# Patient Record
Sex: Female | Born: 1960 | Race: Black or African American | Hispanic: No | Marital: Married
Health system: Southern US, Community
[De-identification: ages and names within clinical notes are randomized; demographics above are authoritative.]

---

## 2003-05-18 ENCOUNTER — Other Ambulatory Visit: Payer: Self-pay

## 2004-02-22 ENCOUNTER — Emergency Department: Payer: Self-pay | Admitting: Emergency Medicine

## 2004-09-04 ENCOUNTER — Emergency Department: Payer: Self-pay | Admitting: Emergency Medicine

## 2004-10-08 ENCOUNTER — Emergency Department: Payer: Self-pay | Admitting: Emergency Medicine

## 2004-10-08 ENCOUNTER — Other Ambulatory Visit: Payer: Self-pay

## 2005-01-13 ENCOUNTER — Emergency Department: Payer: Self-pay | Admitting: Emergency Medicine

## 2005-01-13 ENCOUNTER — Other Ambulatory Visit: Payer: Self-pay

## 2005-02-05 ENCOUNTER — Emergency Department: Payer: Self-pay | Admitting: Emergency Medicine

## 2005-02-17 ENCOUNTER — Other Ambulatory Visit: Payer: Self-pay

## 2005-02-17 ENCOUNTER — Emergency Department: Payer: Self-pay | Admitting: Emergency Medicine

## 2005-03-06 ENCOUNTER — Emergency Department: Payer: Self-pay | Admitting: Emergency Medicine

## 2005-05-28 ENCOUNTER — Emergency Department: Payer: Self-pay | Admitting: Emergency Medicine

## 2006-02-03 ENCOUNTER — Emergency Department: Payer: Self-pay | Admitting: Emergency Medicine

## 2006-02-08 ENCOUNTER — Emergency Department: Payer: Self-pay | Admitting: Emergency Medicine

## 2006-07-18 ENCOUNTER — Emergency Department: Payer: Self-pay | Admitting: General Practice

## 2006-07-18 ENCOUNTER — Other Ambulatory Visit: Payer: Self-pay

## 2006-09-10 IMAGING — CR DG LUMBAR SPINE AP/LAT/OBLIQUES W/ FLEX AND EXT
1 series · 5 of 5 positions shown · non-contrast
Comparison: none

REASON FOR EXAM: Fall pain pt in rm 16
COMMENTS:

PROCEDURE:     DXR - DXR LUMBAR SPINE WITH OBLIQUES  - March 06, 2005  [DATE]
RESULT:       Lumbar spine examination demonstrates the vertebral body
heights are maintained.  There is disc space narrowing at L5-S1.  There is
facet hypertrophy at L4-5 and L5-S1.  No definite fracture is identified.

[Series 1: view not recorded · 0.17mm/px · 5 of 5 slices shown]
[im 1/5]
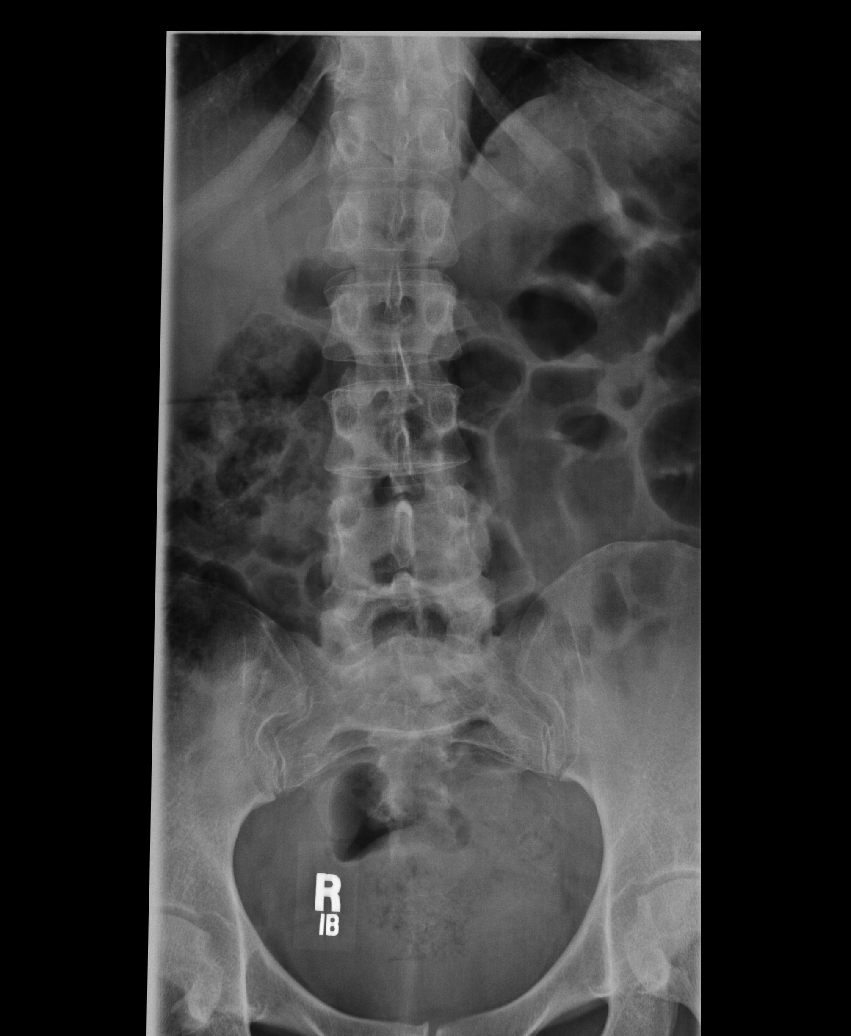
[im 2/5]
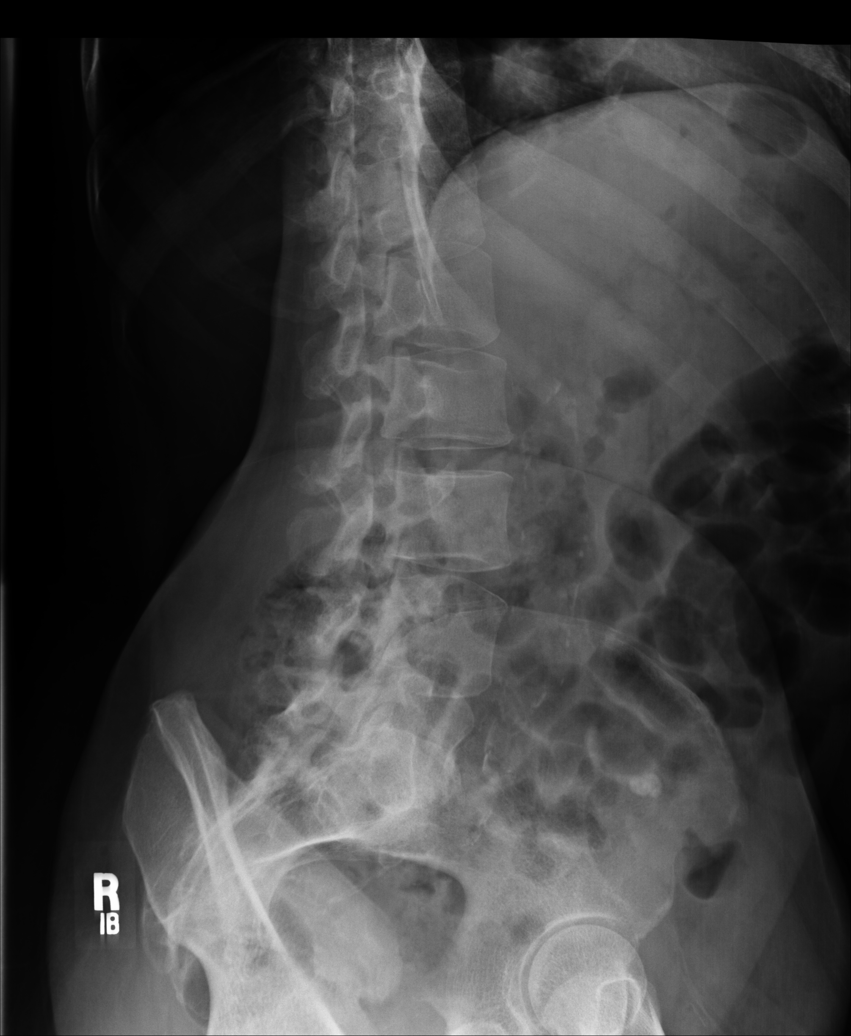
[im 3/5]
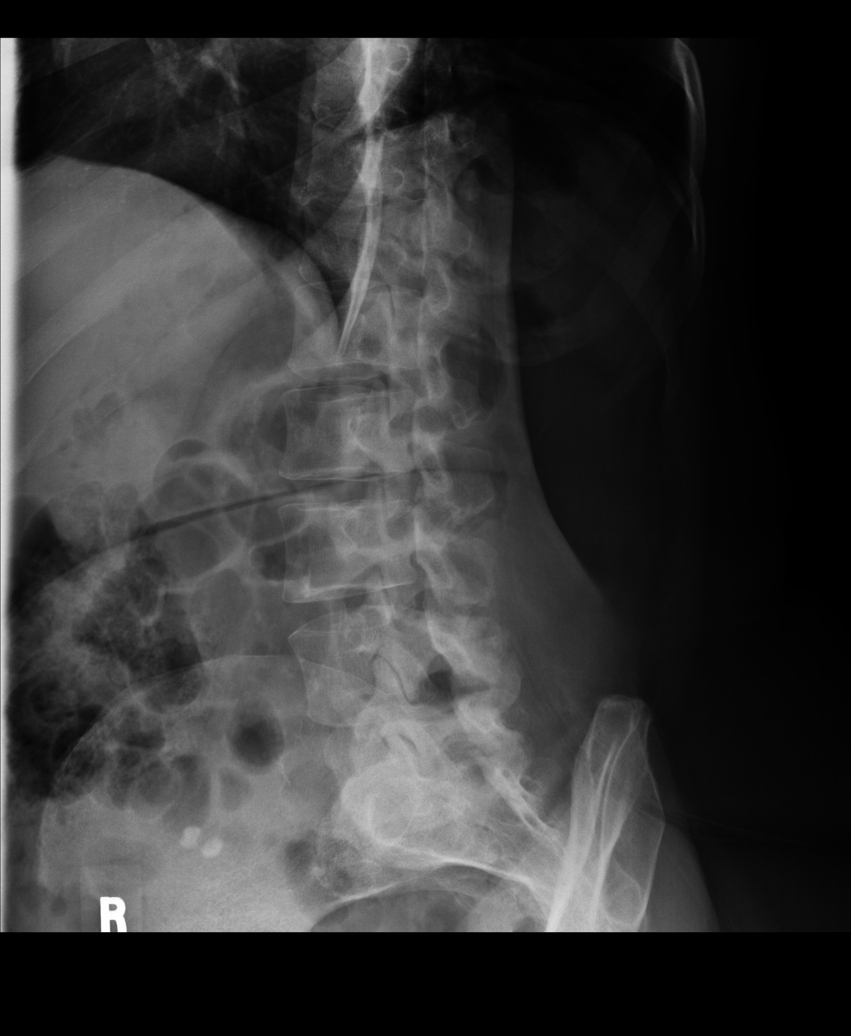
[im 4/5]
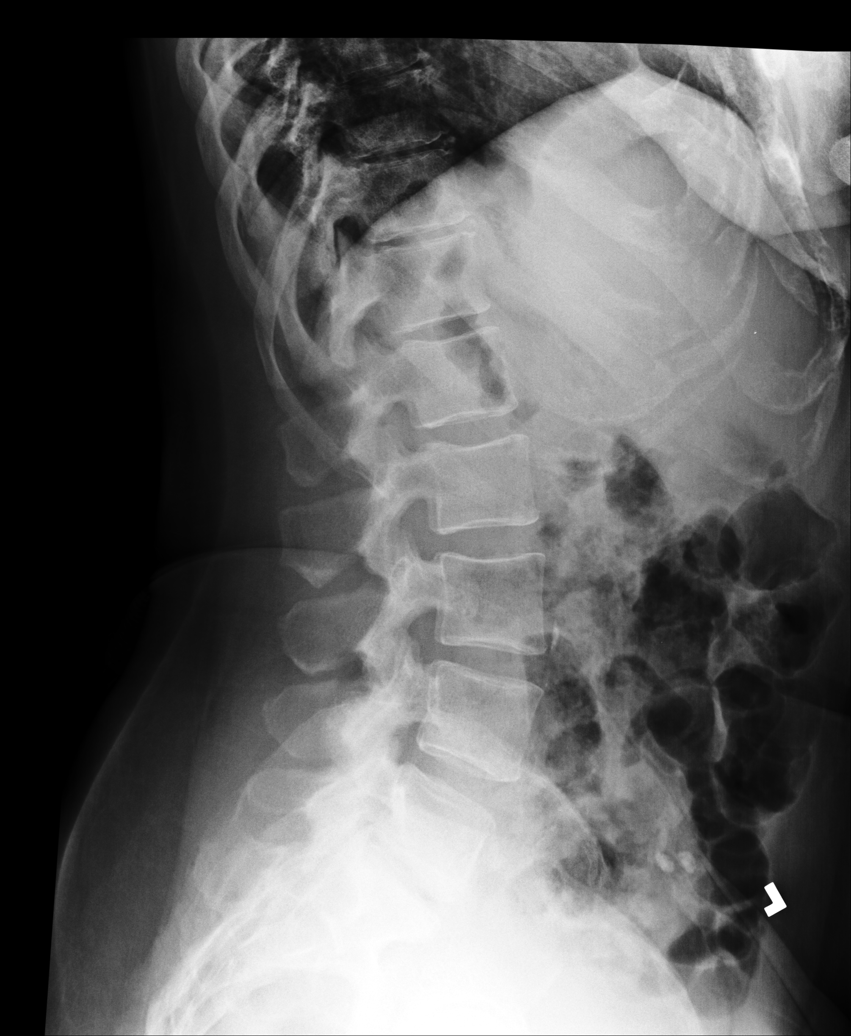
[im 5/5]
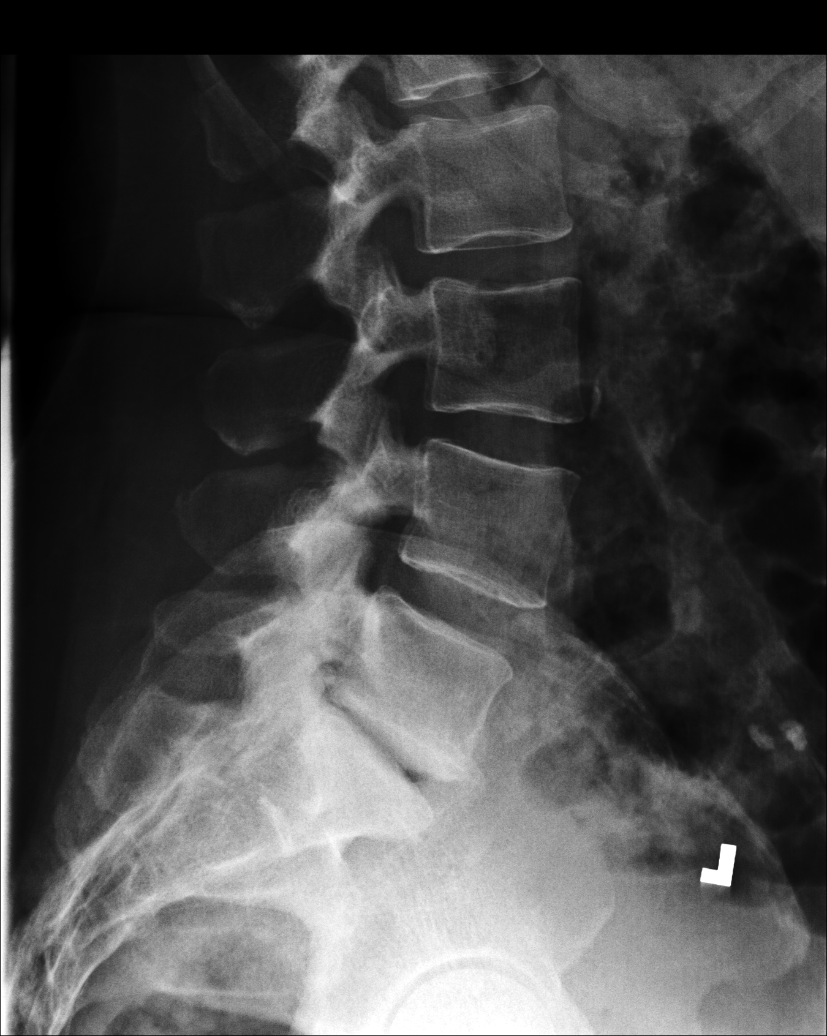

[5 of 5 positions shown; findings below may reference images not displayed]

IMPRESSION: Some degenerative changes in the lower lumbar spine.  No acute bony
abnormality evident.

## 2006-09-10 IMAGING — CR CERVICAL SPINE - COMPLETE 4+ VIEW
1 series · 7 of 7 positions shown · non-contrast
Comparison: none

REASON FOR EXAM: Fall, pain
COMMENTS:

[Series 1: view not recorded · 0.17mm/px · 7 of 7 slices shown]
[im 1/7]
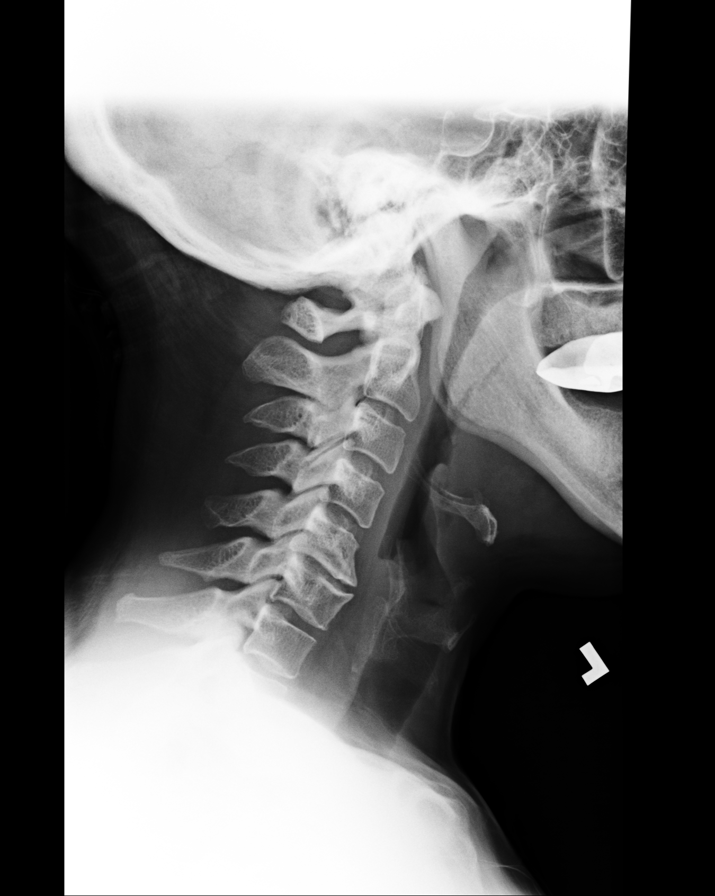
[im 2/7]
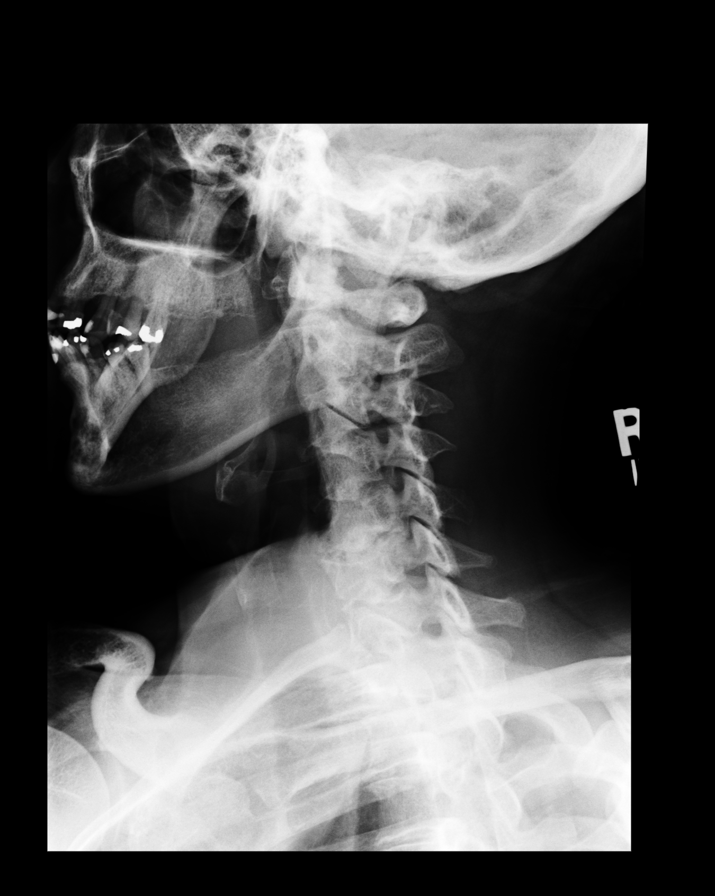
[im 3/7]
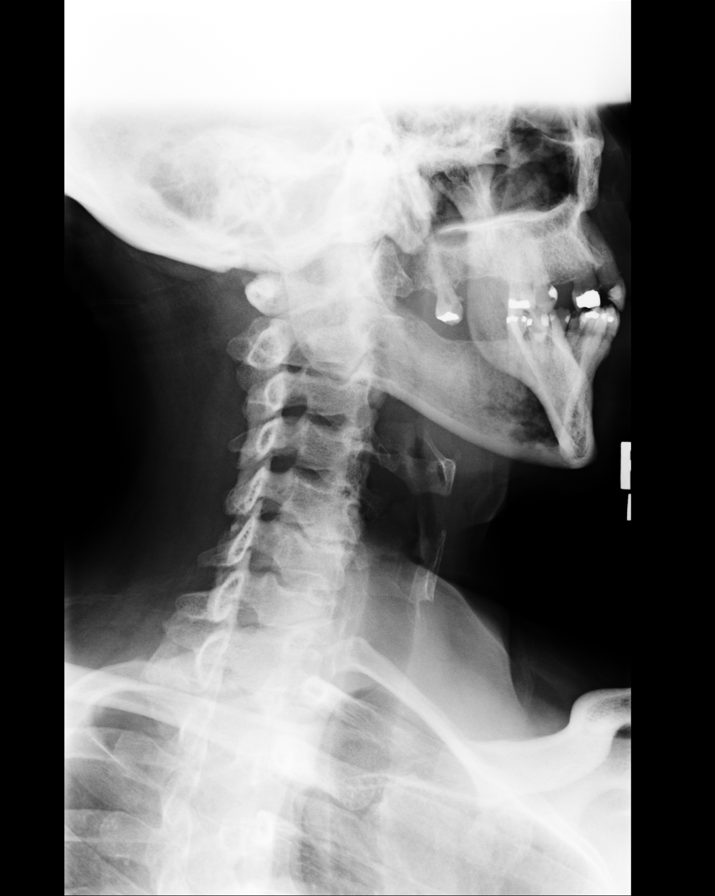
[im 4/7]
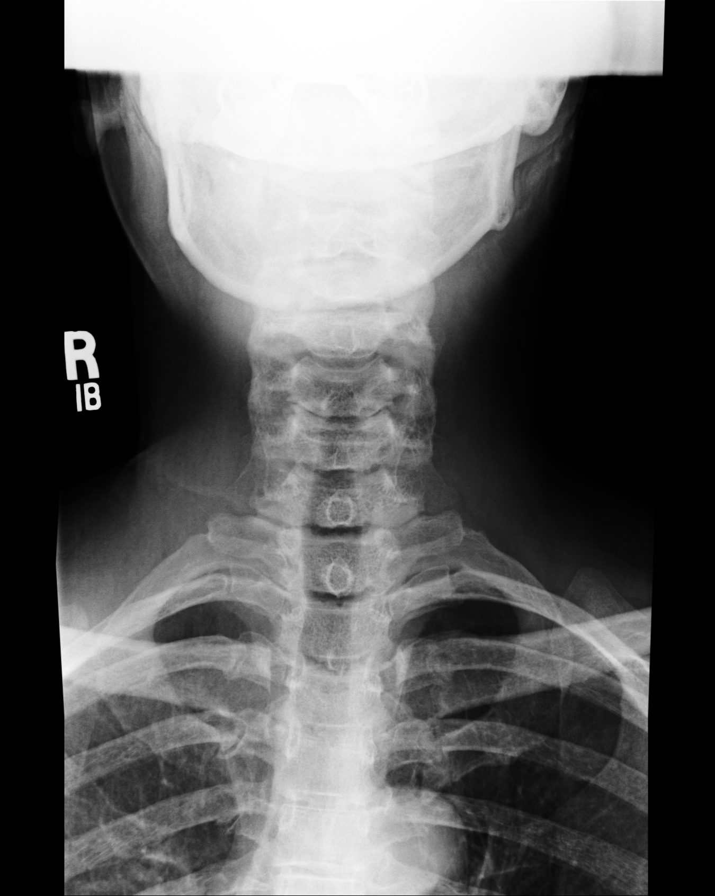
[im 5/7]
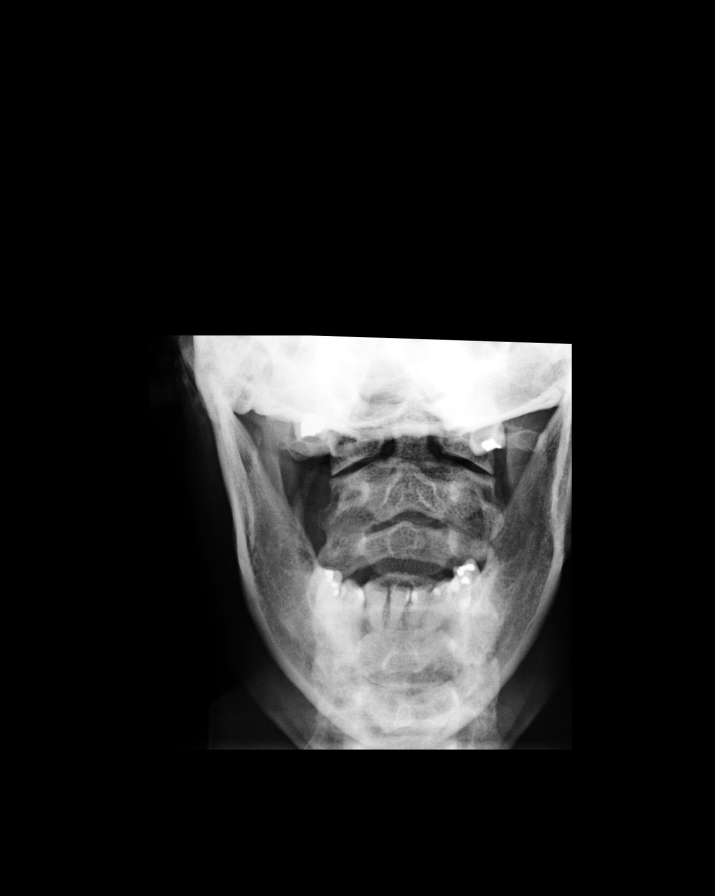
[im 6/7]
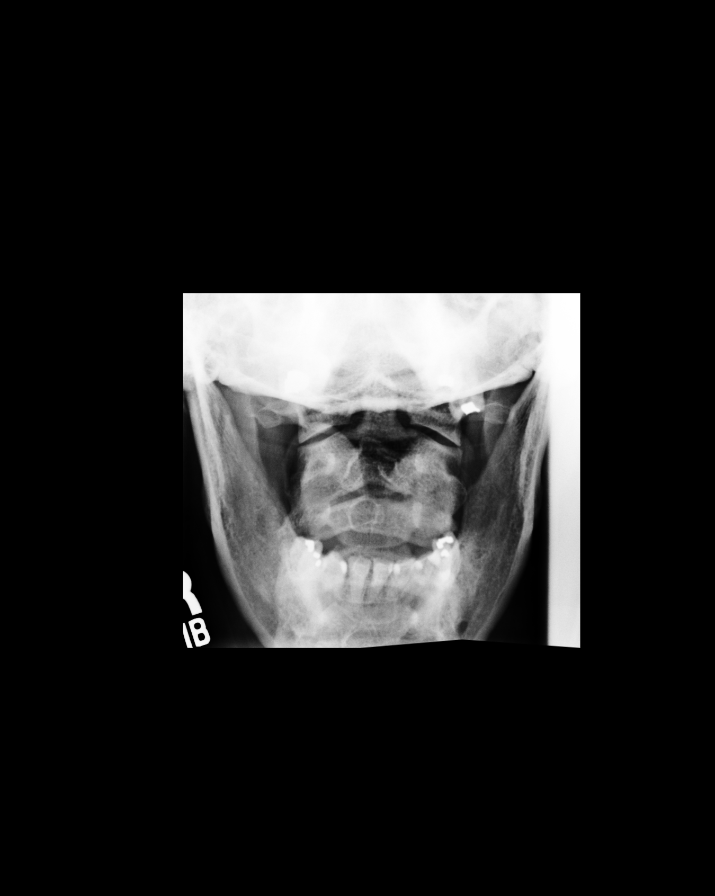
[im 7/7]
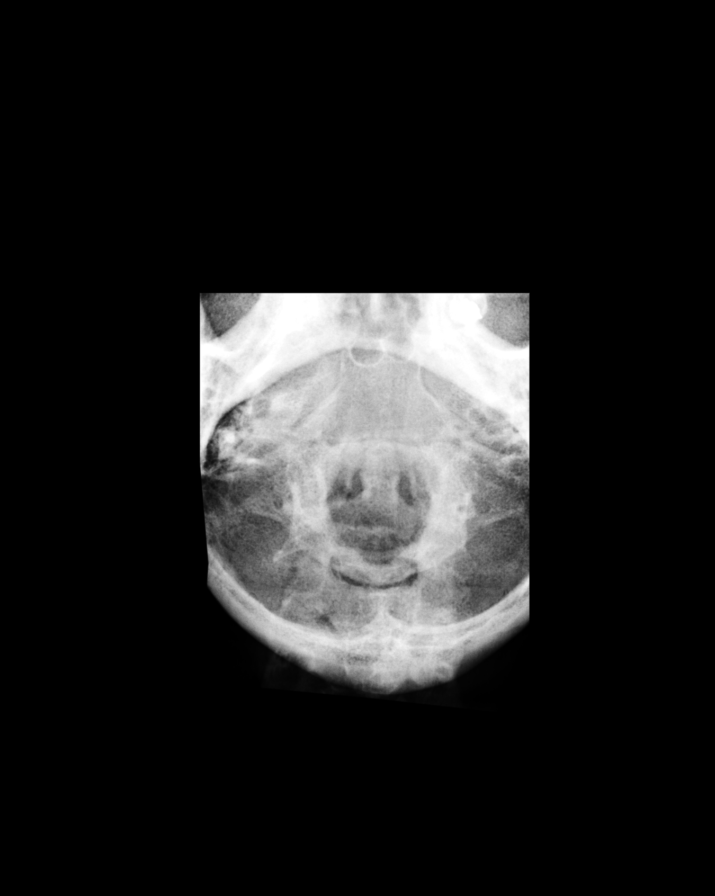

[7 of 7 positions shown; findings below may reference images not displayed]

PROCEDURE:     DXR - DXR CERVICAL SPINE COMPLETE  - March 06, 2005  [DATE]

RESULT:     Images demonstrate intervertebral disc space narrowing with
hypertrophic spurring at the C5-6 level. There is no subluxation. The
prevertebral soft tissues are normal. The odontoid and lateral masses of C1
and C2 appear to be normally aligned.
IMPRESSION: No acute bony abnormality. Degenerative change is noted.

## 2007-05-01 ENCOUNTER — Emergency Department: Payer: Self-pay | Admitting: Emergency Medicine

## 2007-08-02 ENCOUNTER — Other Ambulatory Visit: Payer: Self-pay

## 2007-08-02 ENCOUNTER — Emergency Department: Payer: Self-pay | Admitting: Emergency Medicine

## 2007-08-15 ENCOUNTER — Emergency Department: Payer: Self-pay

## 2007-10-01 ENCOUNTER — Emergency Department: Payer: Self-pay | Admitting: Emergency Medicine

## 2007-10-01 ENCOUNTER — Other Ambulatory Visit: Payer: Self-pay

## 2008-03-05 ENCOUNTER — Emergency Department: Payer: Self-pay | Admitting: Emergency Medicine

## 2008-12-18 ENCOUNTER — Emergency Department: Payer: Self-pay | Admitting: Emergency Medicine

## 2009-11-28 ENCOUNTER — Emergency Department: Payer: Self-pay | Admitting: Emergency Medicine

## 2009-11-29 ENCOUNTER — Emergency Department: Payer: Self-pay | Admitting: Emergency Medicine

## 2011-02-16 LAB — CBC
HCT: 33.9 % — ABNORMAL LOW (ref 35.0–47.0)
MCV: 89 fL (ref 80–100)
RDW: 16 % — ABNORMAL HIGH (ref 11.5–14.5)
WBC: 8.4 10*3/uL (ref 3.6–11.0)

## 2011-02-16 LAB — BASIC METABOLIC PANEL
Anion Gap: 12 (ref 7–16)
BUN: 22 mg/dL — ABNORMAL HIGH (ref 7–18)
Calcium, Total: 8.8 mg/dL (ref 8.5–10.1)
Chloride: 106 mmol/L (ref 98–107)
Co2: 24 mmol/L (ref 21–32)
Creatinine: 1.77 mg/dL — ABNORMAL HIGH (ref 0.60–1.30)
EGFR (Non-African Amer.): 32 — ABNORMAL LOW
Glucose: 120 mg/dL — ABNORMAL HIGH (ref 65–99)
Osmolality: 288 (ref 275–301)
Potassium: 3.7 mmol/L (ref 3.5–5.1)

## 2011-02-16 LAB — ETHANOL
Ethanol %: <0.003 % (ref 0.000–0.080)
Ethanol: <3 mg/dL

## 2011-02-17 ENCOUNTER — Inpatient Hospital Stay: Payer: Self-pay | Admitting: Internal Medicine

## 2011-02-17 LAB — URINALYSIS, COMPLETE
Nitrite: NEGATIVE
Protein: 100
RBC,UR: 70 /HPF (ref 0–5)
WBC UR: 9 /HPF (ref 0–5)

## 2011-02-17 LAB — CK TOTAL AND CKMB (NOT AT ARMC)
CK, Total: 1395 U/L — ABNORMAL HIGH (ref 21–215)
CK, Total: 1313 U/L — ABNORMAL HIGH (ref 21–215)
CK-MB: 6.4 ng/mL — ABNORMAL HIGH (ref 0.5–3.6)

## 2011-02-17 LAB — APTT
Activated PTT: 38.8 secs — ABNORMAL HIGH (ref 23.6–35.9)
Activated PTT: 72.2 secs — ABNORMAL HIGH (ref 23.6–35.9)

## 2011-02-17 LAB — TROPONIN I
Troponin-I: <0.02 ng/mL
Troponin-I: <0.02 ng/mL

## 2011-02-18 LAB — BASIC METABOLIC PANEL
Chloride: 107 mmol/L (ref 98–107)
Co2: 25 mmol/L (ref 21–32)
Creatinine: 1.02 mg/dL (ref 0.60–1.30)
Glucose: 112 mg/dL — ABNORMAL HIGH (ref 65–99)
Potassium: 3.2 mmol/L — ABNORMAL LOW (ref 3.5–5.1)
Sodium: 140 mmol/L (ref 136–145)

## 2011-02-18 LAB — CBC WITH DIFFERENTIAL/PLATELET
Basophil %: 0 %
Eosinophil #: 0 10*3/uL (ref 0.0–0.7)
HGB: 9.6 g/dL — ABNORMAL LOW (ref 12.0–16.0)
Lymphocyte #: 0.8 10*3/uL — ABNORMAL LOW (ref 1.0–3.6)
Lymphocyte %: 17 %
MCH: 29.1 pg (ref 26.0–34.0)
MCHC: 32.2 g/dL (ref 32.0–36.0)
Monocyte #: 0.6 10*3/uL (ref 0.0–0.7)
Neutrophil #: 3.1 10*3/uL (ref 1.4–6.5)
Neutrophil %: 68.9 %
RDW: 16.1 % — ABNORMAL HIGH (ref 11.5–14.5)
WBC: 4.5 10*3/uL (ref 3.6–11.0)

## 2011-02-18 LAB — LIPID PANEL
HDL Cholesterol: 32 mg/dL — ABNORMAL LOW (ref 40–60)
Ldl Cholesterol, Calc: 123 mg/dL — ABNORMAL HIGH (ref 0–100)
Triglycerides: 107 mg/dL (ref 0–200)

## 2011-02-18 LAB — MAGNESIUM: Magnesium: 1.9 mg/dL

## 2011-02-22 LAB — CULTURE, BLOOD (SINGLE)

## 2011-06-05 IMAGING — CR DG CHEST 1V PORT
1 series · 1 of 1 positions shown · non-contrast
Comparison: none

REASON FOR EXAM: hx lung ca, wheezing rll
COMMENTS:

[view not recorded]
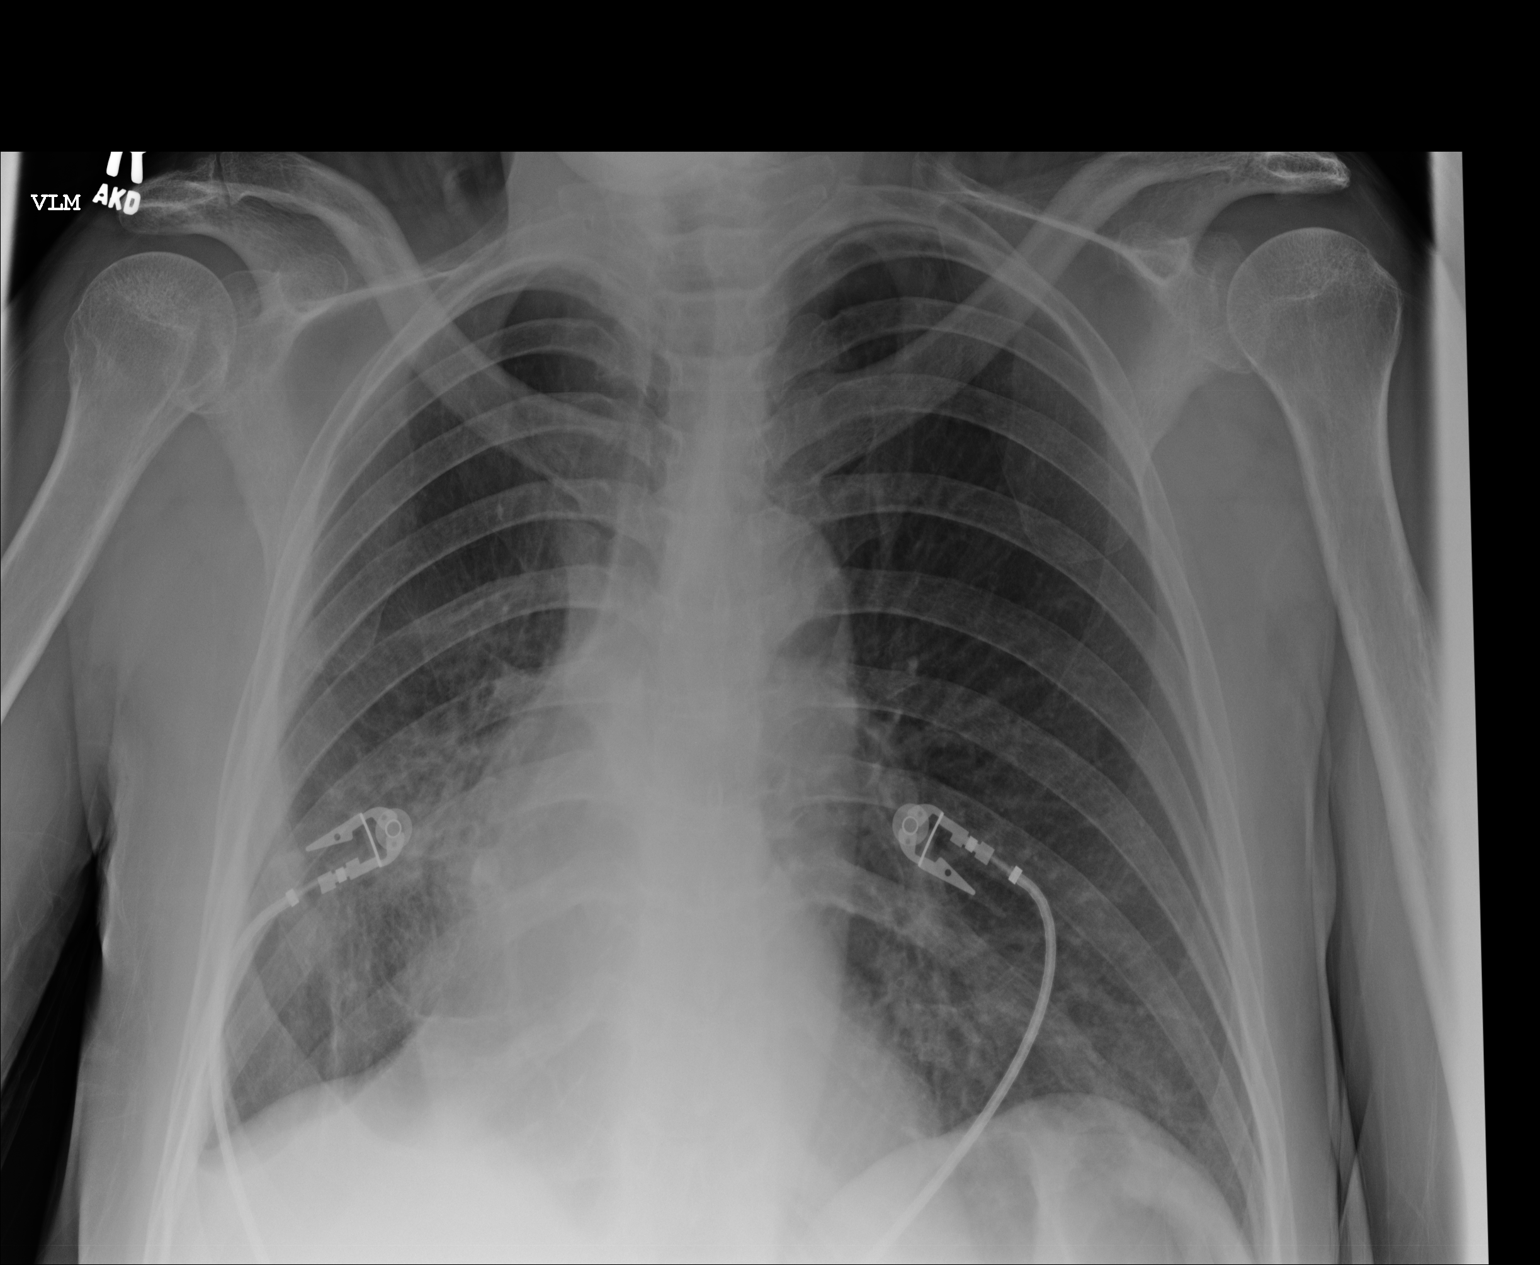

[1 of 1 positions shown; findings below may reference images not displayed]

PROCEDURE:     DXR - DXR PORTABLE CHEST SINGLE VIEW  - November 29, 2009 [DATE]

RESULT:     Comparison is made to the prior exam of 12/18/2008.

There is observed increased density in the right lower lung field and
possibly right middle lobe. The density is much improved as compared to the
exam of 12/18/2008 and may in great part be secondary to fibrosis. There is
evidence of volume loss on the right which would favor fibrosis or
atelectasis. Residual tumor cannot be entirely excluded from the
differential. There is also noted a patchy area of increased density at the
left base which is not seen previously and could represent early pneumonia
or developing fibrosis. The heart size is normal. No pleural effusion is
seen. Monitoring electrodes are present.
IMPRESSION: 1.  There is increased density in the lower lung fields bilaterally, more
prominent on the right. The findings likely are secondary to fibrotic change
but residual tumor or developing infiltrates cannot be entirely excluded at
this point. Continued follow-up and possibly chest CT are recommended, if
such is clinically indicated.
2.  The heart size is normal.
3.  No pulmonary edema or pleural effusion is noted.

## 2011-07-18 ENCOUNTER — Emergency Department: Payer: Self-pay | Admitting: Emergency Medicine

## 2014-05-28 NOTE — Discharge Summary (Signed)
PATIENT NAME:  Christina Bridges, [name] MR#:  [id number] DATE OF BIRTH:  Sep 07, 1960  DATE OF ADMISSION:  02/17/2011 DATE OF DISCHARGE:  02/18/2011  DISCHARGE DIAGNOSES:  1. Pneumonia.  2. Encephalopathy possibly due to taking her nighttime medications in the daytime. 3. Hypotension. 4. Anemia likely of chronic disease.  5. Hyperglycemia.  6. Chest pain due to pneumonia.  7. Mild rhabdomyolysis. 8. Acute renal failure, resolved.  9. History of tobacco abuse. 10. Seizure disorder. 11. Depression. 12. Hyperlipidemia. 13. Chronic obstructive pulmonary disease. 14. Hypokalemia.   DISPOSITION: The patient is being discharged home to follow-up with primary care physician in 1 to 2 weeks after discharge.   DIET: Regular.   ACTIVITY: As tolerated.   DISCHARGE MEDICATIONS:  1. Levaquin 500 mg daily for seven days. 2. ProAir 2 puffs q.6 hours p.r.n.  3. Advair 500/50 1 puff b.i.d.  4. Seroquel 200 mg 3.5 tablets at bedtime.  5. Neurontin 300 mg 2 capsules 3 times a day. 6. Keppra 750 mg 2 tablets b.i.d.  7. Simvastatin 20 mg daily.  8. Vimpat 100 mg b.i.d.   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  Blood cultures showed no growth so far. CBC essentially normal other than hemoglobin of 11.2 to 9.6. Creatinine 1.77 on admission, normal by the time of discharge. Potassium 3.2, replaced. Hyperglycemia. LDL 123. Rest of cholesterol profile normal. Serial cardiac enzymes negative.  HOSPITAL COURSE: The patient is a 54 year old female with past medical history of seizure disorder, lung cancer status post treatment, and chronic obstructive pulmonary disease who presented with change in mental status. The patient reported that she took her nighttime medications including narcotic and sedative which she usually takes at night in the daytime. She was also found to have right-sided pneumonia and  hypotension initially. She was admitted to the hospital and monitored. All her sedative medications were held which resulted in complete resolution of her encephalopathy.   For her right-sided pneumonia, she was treated with empiric antibiotics. Her blood cultures have been negative so far. The patient was hypotensive initially possibly due to taking extra doses of her medications. She was treated with IV fluids which resulted in resolution of her hypotension. She complained of some chest pain. Three sets of cardiac enzymes were checked which were negative. The chest pain was possibly due to pneumonia. She also had acute renal failure possibly due to dehydration which has resolved by the time of discharge. Her fasting lipid profile was checked and her LDL is 123l. Her chronic obstructive pulmonary disease remained stable during the hospitalization. She was found to have mild hypokalemia which was replaced.   Overall the patient's condition remained stable during the hospitalization and she is being discharged home in a stable condition.   TIME SPENT: 45 minutes.   ____________________________ Darrick MeigsSangeeta Jurnie Garritano, MD sp:drc D: 02/18/2011 16:45:32 ET T: 02/19/2011 10:37:23 ET JOB#: [id number]  cc: Darrick MeigsSangeeta Scarlet Abad, MD, <Dictator> Darrick MeigsSANGEETA Elizah Mierzwa MD ELECTRONICALLY SIGNED 02/19/2011 16:27

## 2014-05-28 NOTE — H&P (Signed)
PATIENT NAME:  Christina Bridges, Christina Bridges MR#:  [id number] DATE OF BIRTH:  [birth year]  DATE OF ADMISSION:  02/16/2011  PRIMARY CARE PHYSICIAN:  Dr.  at, [facility] ER PHYSICIAN: Dr. Mindi Junker  ADMITTING PHYSICIAN: Dr. Tilda Franco   PRESENTING COMPLAINT: Chest pain with altered mental status.   HISTORY OF PRESENT ILLNESS: The patient is a 54 year old lady who was brought to the Emergency Room by her husband via private car for altered mental status with increased confusion and lethargy. Her husband noted that the patient had earlier complained of chest pain this afternoon and had taken multiple medications trying to overcome pain. She soon became very drowsy and confused. For this she was brought to the Emergency Room. Here work-up included a chest x-ray which showed a right lower lobe infiltrate. The patient was noted to be drowsy and lethargic during this period.  She was started on clindamycin for possible aspiration pneumonia and referred to hospitalist for further evaluation. EKG done shows sinus tachycardia at a rate of 118, no acute ST-T wave changes. The patient also received one dose of Narcan with some improvement in her mental status. At the time of my examination the patient is sleepy but easily arousable and does state that she did have chest pain earlier today.  The pain was substernal in location, sharp, intensity 10/10, nonradiating, worsened by food. Denies any palpitations. No diaphoresis. No shortness of breath. No PND, orthopnea, or pedal edema. With continued persistence of the pain she had taken extra medication of pain medication, Percocet, oxycodone, Tylenol, and ibuprofen to overcome the pain and does not recall what happened afterwards. Denied any suicidal or homicidal ideation or being depressed. She admits the pain got worse after she ate but no nausea or vomiting. No diarrhea. No PND. No rashes, recent trauma, long distance travel, or lifting of any heavy objects.   REVIEW OF SYSTEMS:   CONSTITUTIONAL: Denies any fever. Admits to feeling weak. No weight loss or weight gain. EYES: No blurred vision. No discharge or redness. ENT: No tinnitus, epistaxis, or difficulty swallowing. RESPIRATORY: Admits to some cough which is nonproductive. No wheezing or painful respirations. CARDIOVASCULAR: Positive for chest pain but no syncope, palpitations, or exertional dyspnea. GI: Denies any nausea, no vomiting. No diarrhea. No change in bowel habits. GU: Denies any dysuria, frequency, or incontinence.  ENDOCRINE: No polyuria, polydipsia, heat or cold intolerance. HEMATOLOGIC: No anemia, easy bruising or bleeding, or swollen glands. SKIN: No rashes. No change in hair or skin texture.  MUSCULOSKELETAL: No joint pains, redness, swelling, or limited activity. NEURO: Positive for generalized weakness now. Drowsiness but no vertigo or tremor.  PSYCHIATRIC: Denies any depression or nervousness. No anxiety.   PAST MEDICAL HISTORY:  1. Hypertension.  2. Hyperlipidemia.  3. Depression.  4. Anemia of chronic disease.  5. History of seizures.  6. Asthma. 7. Chronic obstructive pulmonary disease. 8. History of lung cancer status post chemotherapy with carboplatin and etoposide and radiotherapy.  9. 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   PAST SURGICAL HISTORY:  Hysterectomy with oophorectomy.   SOCIAL HISTORY: She lives with her husband. Active cigarette smoker, 34 pack-year history. She denies any recreational drug use.   FAMILY HISTORY: Positive for metastatic kidney cancer in her mother, tuberculosis in the father.  History of coronary artery disease in first-degree relatives, in her brothers.   ALLERGIES: Aspirin gives her hives and ibuprofen causes generalized itching.  MEDICATIONS:  1. Acetaminophen/ codeine 300 mg/30, 1 tablet once a day at bedtime.  2. Advair Diskus 500 mcg/ 50, 1 puff  twice a day.  3. Alprazolam 0.5 mg 3 times daily. 4. Doc-Q-Lace 100 mg 1 tablet twice a day. 5. Flovent inhaler 1 puff twice a day.  6. Gabapentin 300 mg, 2 capsules 3 times a day.  7. Keppra 750 mg 2 tablets twice a day.  8. Morphine 15 mg 1 tab in the morning and at bedtime.  9. Oxycodone 5 mg, 1 cap q. 8 hours. 10. ProAir inhaler 1 puff p.r.n.  11. Prochlorperazine 10 mg 3 times daily.  12. Quetiapine 200 mg, 3.5 tablets at bedtime.  13. Simvastatin 20 mg once at bedtime.  14. Vimpat 100 mg twice daily.   PHYSICAL EXAMINATION:  VITAL SIGNS: Temperature 99.1, pulse 122, respiratory rate 20, blood pressure 80/55 on arrival, oxygen saturation 92% on room air.   GENERAL: Middle-aged [place] lady sleeping comfortably on the gurney but easily arousable. She is drowsy but aware and oriented to person, place, and time, in no obvious distress.   HEENT: Atraumatic, normocephalic. Pupils equal, reactive to light and accommodation. Extraocular movements intact. Mucous membranes pink, moist.   NECK: Supple. No JV distention.   CHEST: Good air entry. Transmitted breath sounds with decreased air entry in the right lung base. No obvious rhonchi, no rales.   HEART: Regular rhythm, tachycardic. No murmurs.   ABDOMEN:  Full, moves with respiration. No tenderness. Bowel sounds normoactive. No organomegaly.   EXTREMITIES: No edema. No clubbing. No deformity.   NEUROLOGICAL: Cranial nerves II through XII grossly intact. No focal motor or sensory deficit. Affect flat, but seems to have insight into her condition.   LABORATORY, DIAGNOSTIC, AND RADIOLOGICAL DATA: EKG shows sinus tachycardia, rate of 118, nonspecific T- wave changes. Chest x-ray shows right lower lobe infiltrate by my interpretation.   CBC unremarkable with white count 8, hemoglobin 11, platelets 189. Chemistry shows sodium 142, potassium 3.7, bicarbonate 24, creatinine 1.7, up from a baseline of 1.3 from four months ago. BUN  22, glucose 120. Alcohol level is less than 3.  No cardiac enzymes in the system.    IMPRESSION:  1. Altered mental status secondary to accidental overdose.  2. Possible aspiration pneumonia.  3. Chest pain, rule out ACS in view of smoking history, family history. Also possibly gastritis.  4. Acute kidney injury not otherwise specified.  5. History of tobacco use. 6. Seizure disorder.  7. Hypertension, currently hypotensive.   8. History of depression, stable.  9. Hyperlipidemia, stable.  10. History of chronic obstructive pulmonary disease/asthma, stable.   PLAN:  Admit to general medical floor with telemetry for respiratory support. IV antibiotics. P.r.n. nebulizers. IV fluid for rehydration. We will obtain blood cultures times two.  Obtain serial cardiac enzymes. Check TSH and magnesium. Smoking cessation advised. Nicotine patch offered. Continue seizure, fall, and aspiration precautions. Echo and stress test in the a.m. if ruled out by enzymes. Aspirin cannot be used here due to her allergy aspirin. P.r.n. nitroglycerin and morphine.  GI prophylaxis with Protonix. Deep vein thrombosis prophylaxis with heparin.  P.r.n. Narcan if the patient becomes unresponsive.  Resume the rest of her outpatient medications but hold off on the quetiapine, oxycodone, morphine, gabapentin, and alprazolam until she is fully awake.  CODE STATUS: FULL CODE.   TOTAL PATIENT CARE TIME: 50 minutes.   ____________________________ Christina SabinaMarcel I. Tilda FrancoAkuneme, MD mia:bjt D: 02/17/2011 01:00:21 ET T: 02/17/2011 07:13:11 ET  JOB#: [id number]  cc: Jolene Guyett I. Tilda Franco, MD, <Dictator> Margaret Pyle MD ELECTRONICALLY SIGNED 02/20/2011 16:29

## 2014-11-20 ENCOUNTER — Other Ambulatory Visit
Admission: RE | Admit: 2014-11-20 | Discharge: 2014-11-20 | Disposition: A | Discharge status: Home / Self Care | Payer: Medicare HMO | Source: Ambulatory Visit | Attending: Student | Admitting: Student

## 2014-11-20 DIAGNOSIS — M868X8 Other osteomyelitis, other site: Secondary | ICD-10-CM | POA: Diagnosis present

## 2014-11-20 LAB — CREATININE, SERUM
Creatinine, Ser: 0.77 mg/dL (ref 0.44–1.00)
GFR calc Af Amer: >60 mL/min (ref >60)

## 2014-11-20 LAB — CBC WITH DIFFERENTIAL/PLATELET
BASOS ABS: 0 10*3/uL (ref 0–0.1)
Basophils Relative: 1 %
EOS PCT: 0 %
Eosinophils Absolute: 0 10*3/uL (ref 0–0.7)
HEMATOCRIT: 30.4 % — AB (ref 35.0–47.0)
Hemoglobin: 9.8 g/dL — ABNORMAL LOW (ref 12.0–16.0)
LYMPHS PCT: 32 %
Lymphs Abs: 1.1 10*3/uL (ref 1.0–3.6)
MCH: 28.5 pg (ref 26.0–34.0)
MCHC: 32.4 g/dL (ref 32.0–36.0)
MCV: 88 fL (ref 80.0–100.0)
MONO ABS: 0.5 10*3/uL (ref 0.2–0.9)
MONOS PCT: 14 %
NEUTROS ABS: 1.8 10*3/uL (ref 1.4–6.5)
Neutrophils Relative %: 53 %
PLATELETS: 176 10*3/uL (ref 150–440)
RBC: 3.45 MIL/uL — ABNORMAL LOW (ref 3.80–5.20)
RDW: 17.9 % — AB (ref 11.5–14.5)
WBC: 3.4 10*3/uL — ABNORMAL LOW (ref 3.6–11.0)

## 2014-11-20 LAB — BUN: BUN: 7 mg/dL (ref 6–20)

## 2014-11-21 LAB — VANCOMYCIN, TROUGH: VANCOMYCIN TR: 28 ug/mL — AB (ref 10–20)

## 2014-11-23 ENCOUNTER — Other Ambulatory Visit
Admission: RE | Admit: 2014-11-23 | Discharge: 2014-11-23 | Disposition: A | Discharge status: Home / Self Care | Payer: Medicare HMO | Source: Ambulatory Visit | Attending: *Deleted | Admitting: *Deleted

## 2014-11-29 ENCOUNTER — Other Ambulatory Visit
Admission: RE | Admit: 2014-11-29 | Discharge: 2014-11-29 | Disposition: A | Discharge status: Home / Self Care | Payer: Medicare HMO | Source: Ambulatory Visit | Attending: Student | Admitting: Student

## 2014-11-29 DIAGNOSIS — A419 Sepsis, unspecified organism: Secondary | ICD-10-CM | POA: Diagnosis present

## 2014-11-29 LAB — CREATININE, SERUM
Creatinine, Ser: 1.18 mg/dL — ABNORMAL HIGH (ref 0.44–1.00)
GFR, EST AFRICAN AMERICAN: 59 mL/min — AB (ref >60)
GFR, EST NON AFRICAN AMERICAN: 51 mL/min — AB (ref >60)

## 2014-11-29 LAB — CBC WITH DIFFERENTIAL/PLATELET
BASOS ABS: 0 10*3/uL (ref 0–0.1)
Basophils Relative: 1 %
EOS ABS: 0 10*3/uL (ref 0–0.7)
Eosinophils Relative: 0 %
HCT: 30.5 % — ABNORMAL LOW (ref 35.0–47.0)
Hemoglobin: 9.7 g/dL — ABNORMAL LOW (ref 12.0–16.0)
Lymphocytes Relative: 31 %
Lymphs Abs: 0.8 10*3/uL — ABNORMAL LOW (ref 1.0–3.6)
MCH: 27.7 pg (ref 26.0–34.0)
MCHC: 31.8 g/dL — ABNORMAL LOW (ref 32.0–36.0)
MCV: 87.3 fL (ref 80.0–100.0)
MONO ABS: 0.4 10*3/uL (ref 0.2–0.9)
Monocytes Relative: 15 %
Neutro Abs: 1.5 10*3/uL (ref 1.4–6.5)
Neutrophils Relative %: 53 %
PLATELETS: 175 10*3/uL (ref 150–440)
RBC: 3.5 MIL/uL — AB (ref 3.80–5.20)
RDW: 17.8 % — AB (ref 11.5–14.5)
WBC: 2.7 10*3/uL — AB (ref 3.6–11.0)

## 2014-11-29 LAB — BUN

## 2014-11-29 LAB — VANCOMYCIN, TROUGH: Vancomycin Tr: 37 ug/mL (ref 10–20)

## 2014-12-01 ENCOUNTER — Other Ambulatory Visit
Admission: RE | Admit: 2014-12-01 | Discharge: 2014-12-01 | Disposition: A | Discharge status: Home / Self Care | Payer: Medicare HMO | Source: Ambulatory Visit | Attending: Student | Admitting: Student

## 2014-12-01 DIAGNOSIS — A419 Sepsis, unspecified organism: Secondary | ICD-10-CM | POA: Diagnosis present

## 2014-12-01 LAB — VANCOMYCIN, RANDOM: VANCOMYCIN RM: 15 ug/mL

## 2014-12-01 LAB — CREATININE, SERUM
CREATININE: 1.18 mg/dL — AB (ref 0.44–1.00)
GFR calc Af Amer: 59 mL/min — ABNORMAL LOW (ref >60)
GFR calc non Af Amer: 51 mL/min — ABNORMAL LOW (ref >60)

## 2014-12-01 LAB — BUN: BUN: 6 mg/dL (ref 6–20)

## 2014-12-04 ENCOUNTER — Other Ambulatory Visit
Admission: RE | Admit: 2014-12-04 | Discharge: 2014-12-04 | Disposition: A | Discharge status: Home / Self Care | Payer: Medicare HMO | Source: Skilled Nursing Facility | Attending: Student | Admitting: Student

## 2014-12-04 DIAGNOSIS — A419 Sepsis, unspecified organism: Secondary | ICD-10-CM | POA: Insufficient documentation

## 2014-12-04 LAB — CBC WITH DIFFERENTIAL/PLATELET
BASOS ABS: 0 10*3/uL (ref 0–0.1)
EOS ABS: 0 10*3/uL (ref 0–0.7)
Eosinophils Relative: 0 %
HEMATOCRIT: 35.5 % (ref 35.0–47.0)
HEMOGLOBIN: 11 g/dL — AB (ref 12.0–16.0)
Lymphocytes Relative: 31 %
Lymphs Abs: 1.1 10*3/uL (ref 1.0–3.6)
MCH: 27.4 pg (ref 26.0–34.0)
MCHC: 31.1 g/dL — ABNORMAL LOW (ref 32.0–36.0)
MCV: 87.9 fL (ref 80.0–100.0)
Monocytes Absolute: 0.4 10*3/uL (ref 0.2–0.9)
NEUTROS ABS: 2 10*3/uL (ref 1.4–6.5)
Platelets: 145 10*3/uL — ABNORMAL LOW (ref 150–440)
RBC: 4.03 MIL/uL (ref 3.80–5.20)
RDW: 18.4 % — ABNORMAL HIGH (ref 11.5–14.5)
WBC: 3.6 10*3/uL (ref 3.6–11.0)

## 2014-12-04 LAB — CREATININE, SERUM
CREATININE: 1.23 mg/dL — AB (ref 0.44–1.00)
GFR calc Af Amer: 57 mL/min — ABNORMAL LOW (ref >60)
GFR, EST NON AFRICAN AMERICAN: 49 mL/min — AB (ref >60)

## 2014-12-04 LAB — BUN: BUN: 7 mg/dL (ref 6–20)

## 2014-12-04 LAB — VANCOMYCIN, TROUGH: VANCOMYCIN TR: 34 ug/mL — AB (ref 10–20)

## 2014-12-11 ENCOUNTER — Other Ambulatory Visit
Admission: RE | Admit: 2014-12-11 | Discharge: 2014-12-11 | Disposition: A | Discharge status: Home / Self Care | Payer: Medicare HMO | Source: Ambulatory Visit | Attending: Student | Admitting: Student

## 2014-12-11 DIAGNOSIS — A419 Sepsis, unspecified organism: Secondary | ICD-10-CM | POA: Diagnosis present

## 2014-12-11 LAB — CBC WITH DIFFERENTIAL/PLATELET
Basophils Absolute: 0 10*3/uL (ref 0–0.1)
Basophils Relative: 1 %
EOS ABS: 0 10*3/uL (ref 0–0.7)
Eosinophils Relative: 0 %
HCT: 31.2 % — ABNORMAL LOW (ref 35.0–47.0)
HEMOGLOBIN: 10 g/dL — AB (ref 12.0–16.0)
LYMPHS ABS: 0.9 10*3/uL — AB (ref 1.0–3.6)
LYMPHS PCT: 28 %
MCH: 28 pg (ref 26.0–34.0)
MCHC: 32.1 g/dL (ref 32.0–36.0)
MCV: 87.2 fL (ref 80.0–100.0)
Monocytes Absolute: 0.4 10*3/uL (ref 0.2–0.9)
Monocytes Relative: 11 %
NEUTROS PCT: 60 %
Neutro Abs: 1.9 10*3/uL (ref 1.4–6.5)
Platelets: 173 10*3/uL (ref 150–440)
RBC: 3.58 MIL/uL — AB (ref 3.80–5.20)
RDW: 18.6 % — ABNORMAL HIGH (ref 11.5–14.5)
WBC: 3.2 10*3/uL — AB (ref 3.6–11.0)

## 2014-12-11 LAB — BUN: BUN: 9 mg/dL (ref 6–20)

## 2014-12-11 LAB — VANCOMYCIN, TROUGH: VANCOMYCIN TR: 39 ug/mL — AB (ref 10–20)

## 2014-12-11 LAB — CREATININE, SERUM
Creatinine, Ser: 1.67 mg/dL — ABNORMAL HIGH (ref 0.44–1.00)
GFR calc non Af Amer: 34 mL/min — ABNORMAL LOW (ref >60)
GFR, EST AFRICAN AMERICAN: 39 mL/min — AB (ref >60)

## 2014-12-14 ENCOUNTER — Other Ambulatory Visit
Admission: RE | Admit: 2014-12-14 | Discharge: 2014-12-14 | Disposition: A | Discharge status: Home / Self Care | Payer: Medicare HMO | Source: Ambulatory Visit | Attending: Internal Medicine | Admitting: Internal Medicine

## 2014-12-14 DIAGNOSIS — A419 Sepsis, unspecified organism: Secondary | ICD-10-CM | POA: Diagnosis present

## 2014-12-14 LAB — CREATININE, SERUM
CREATININE: 1.51 mg/dL — AB (ref 0.44–1.00)
GFR calc Af Amer: 44 mL/min — ABNORMAL LOW (ref >60)
GFR, EST NON AFRICAN AMERICAN: 38 mL/min — AB (ref >60)

## 2014-12-14 LAB — VANCOMYCIN, RANDOM: Vancomycin Rm: 13 ug/mL

## 2014-12-14 LAB — BUN: BUN: 9 mg/dL (ref 6–20)

## 2014-12-18 ENCOUNTER — Other Ambulatory Visit
Admission: RE | Admit: 2014-12-18 | Discharge: 2014-12-18 | Disposition: A | Discharge status: Home / Self Care | Payer: Medicare HMO | Source: Ambulatory Visit | Attending: Student | Admitting: Student

## 2014-12-18 DIAGNOSIS — Z029 Encounter for administrative examinations, unspecified: Secondary | ICD-10-CM | POA: Diagnosis present

## 2014-12-18 LAB — CREATININE, SERUM
Creatinine, Ser: 1.51 mg/dL — ABNORMAL HIGH (ref 0.44–1.00)
GFR, EST AFRICAN AMERICAN: 44 mL/min — AB (ref >60)
GFR, EST NON AFRICAN AMERICAN: 38 mL/min — AB (ref >60)

## 2014-12-18 LAB — CBC WITH DIFFERENTIAL/PLATELET
Basophils Absolute: 0 10*3/uL (ref 0–0.1)
Basophils Relative: 1 %
EOS PCT: 0 %
Eosinophils Absolute: 0 10*3/uL (ref 0–0.7)
HEMATOCRIT: 33.4 % — AB (ref 35.0–47.0)
Hemoglobin: 10.4 g/dL — ABNORMAL LOW (ref 12.0–16.0)
LYMPHS ABS: 1.1 10*3/uL (ref 1.0–3.6)
LYMPHS PCT: 35 %
MCH: 27.1 pg (ref 26.0–34.0)
MCHC: 31.1 g/dL — ABNORMAL LOW (ref 32.0–36.0)
MCV: 87.1 fL (ref 80.0–100.0)
MONO ABS: 0.4 10*3/uL (ref 0.2–0.9)
Monocytes Relative: 12 %
NEUTROS ABS: 1.6 10*3/uL (ref 1.4–6.5)
Neutrophils Relative %: 52 %
PLATELETS: 181 10*3/uL (ref 150–440)
RBC: 3.83 MIL/uL (ref 3.80–5.20)
RDW: 18.3 % — ABNORMAL HIGH (ref 11.5–14.5)
WBC: 3 10*3/uL — AB (ref 3.6–11.0)

## 2014-12-18 LAB — BUN: BUN: 10 mg/dL (ref 6–20)

## 2014-12-18 LAB — VANCOMYCIN, TROUGH: Vancomycin Tr: 9 ug/mL — ABNORMAL LOW (ref 10–20)

## 2015-06-04 DEATH — deceased
